# Patient Record
Sex: Female | Born: 1975
Health system: Southern US, Community
[De-identification: ages and names within clinical notes are randomized; demographics above are authoritative.]

## PROBLEM LIST (undated history)

## (undated) DIAGNOSIS — Z720 Tobacco use: Secondary | ICD-10-CM

## (undated) DIAGNOSIS — E785 Hyperlipidemia, unspecified: Secondary | ICD-10-CM

## (undated) DIAGNOSIS — J45909 Unspecified asthma, uncomplicated: Secondary | ICD-10-CM

## (undated) DIAGNOSIS — F419 Anxiety disorder, unspecified: Secondary | ICD-10-CM

## (undated) HISTORY — DX: Unspecified asthma, uncomplicated: J45.909

## (undated) HISTORY — PX: TUBAL LIGATION: SHX77

## (undated) HISTORY — DX: Anxiety disorder, unspecified: F41.9

## (undated) HISTORY — DX: Hyperlipidemia, unspecified: E78.5

## (undated) HISTORY — DX: Tobacco use: Z72.0

---

## 2015-12-06 ENCOUNTER — Encounter: Payer: Self-pay | Admitting: Physician Assistant

## 2016-12-18 ENCOUNTER — Encounter: Payer: Self-pay | Admitting: Physician Assistant

## 2018-10-13 ENCOUNTER — Ambulatory Visit (INDEPENDENT_AMBULATORY_CARE_PROVIDER_SITE_OTHER): Payer: Commercial Managed Care - PPO | Admitting: Physician Assistant

## 2018-10-13 ENCOUNTER — Encounter: Payer: Self-pay | Admitting: Physician Assistant

## 2018-10-13 VITALS — BP 121/86 | HR 90 | Resp 16 | Ht 63.0 in | Wt 192.0 lb

## 2018-10-13 DIAGNOSIS — E782 Mixed hyperlipidemia: Secondary | ICD-10-CM

## 2018-10-13 DIAGNOSIS — Z8349 Family history of other endocrine, nutritional and metabolic diseases: Secondary | ICD-10-CM | POA: Insufficient documentation

## 2018-10-13 DIAGNOSIS — Z23 Encounter for immunization: Secondary | ICD-10-CM | POA: Diagnosis not present

## 2018-10-13 DIAGNOSIS — Z1231 Encounter for screening mammogram for malignant neoplasm of breast: Secondary | ICD-10-CM

## 2018-10-13 DIAGNOSIS — Z7689 Persons encountering health services in other specified circumstances: Secondary | ICD-10-CM | POA: Diagnosis not present

## 2018-10-13 DIAGNOSIS — J45909 Unspecified asthma, uncomplicated: Secondary | ICD-10-CM | POA: Diagnosis not present

## 2018-10-13 DIAGNOSIS — R0600 Dyspnea, unspecified: Secondary | ICD-10-CM | POA: Diagnosis not present

## 2018-10-13 DIAGNOSIS — E785 Hyperlipidemia, unspecified: Secondary | ICD-10-CM

## 2018-10-13 DIAGNOSIS — R079 Chest pain, unspecified: Secondary | ICD-10-CM | POA: Diagnosis not present

## 2018-10-13 DIAGNOSIS — Z113 Encounter for screening for infections with a predominantly sexual mode of transmission: Secondary | ICD-10-CM

## 2018-10-13 DIAGNOSIS — F17200 Nicotine dependence, unspecified, uncomplicated: Secondary | ICD-10-CM

## 2018-10-13 DIAGNOSIS — Z130 Encounter for screening for diseases of the blood and blood-forming organs and certain disorders involving the immune mechanism: Secondary | ICD-10-CM

## 2018-10-13 DIAGNOSIS — R002 Palpitations: Secondary | ICD-10-CM | POA: Diagnosis not present

## 2018-10-13 MED ORDER — ALBUTEROL SULFATE HFA 108 (90 BASE) MCG/ACT IN AERS: 1.0000 | INHALATION_SPRAY | RESPIRATORY_TRACT | 0 refills | Status: DC | PRN

## 2018-10-13 NOTE — Patient Instructions (Signed)
For your blood pressure: - Goal <130/80 (Ideally 120's/70's) - monitor and log blood pressures at home - check around the same time each day in a relaxed setting - Limit salt to <2500 mg/day - Follow DASH (Dietary Approach to Stopping Hypertension) eating plan - Try to get at least 150 minutes of aerobic exercise per week - Aim to go on a brisk walk 30 minutes per day at least 5 days per week. If you're not active, gradually increase how long you walk by 5 minutes each week - limit alcohol: 2 standard drinks per day for men and 1 per day for women - avoid tobacco/nicotine products. Consider smoking cessation if you smoke - weight loss: 7% of current body weight can reduce your blood pressure by 5-10 points - follow-up at least every 6 months for your blood pressure. Follow-up sooner if your BP is not controlled   Nonspecific Chest Pain, Adult Chest pain can be caused by many different conditions. It can be caused by a condition that is life-threatening and requires treatment right away. It can also be caused by something that is not life-threatening. If you have chest pain, it can be hard to know the difference, so it is important to get help right away to make sure that you do not have a serious condition. Some life-threatening causes of chest pain include:  Heart attack.  A tear in the body's main blood vessel (aortic dissection).  Inflammation around your heart (pericarditis).  A problem in the lungs, such as a blood clot (pulmonary embolism) or a collapsed lung (pneumothorax). Some non life-threatening causes of chest pain include:  Heartburn.  Anxiety or stress.  Damage to the bones, muscles, and cartilage that make up your chest wall.  Pneumonia or bronchitis.  Shingles infection (varicella-zoster virus). Chest pain can feel like:  Pain or discomfort on the surface of your chest or deep in your chest.  Crushing, pressure, aching, or squeezing pain.  Burning or  tingling.  Dull or sharp pain that is worse when you move, cough, or take a deep breath.  Pain or discomfort that is also felt in your back, neck, jaw, shoulder, or arm, or pain that spreads to any of these areas. Your chest pain may come and go. It may also be constant. Your health care provider will do lab tests and other studies to find the cause of your pain. Treatment will depend on the cause of your chest pain. Follow these instructions at home: Medicines  Take over-the-counter and prescription medicines only as told by your health care provider.  If you were prescribed an antibiotic, take it as told by your health care provider. Do not stop taking the antibiotic even if you start to feel better. Lifestyle   Rest as directed by your health care provider.  Do not use any products that contain nicotine or tobacco, such as cigarettes and e-cigarettes. If you need help quitting, ask your health care provider.  Do not drink alcohol.  Make healthy lifestyle choices as recommended. These may include: ? Getting regular exercise. Ask your health care provider to suggest some activities that are safe for you. ? Eating a heart-healthy diet. This includes plenty of fresh fruits and vegetables, whole grains, low-fat (lean) protein, and low-fat dairy products. A dietitian can help you find healthy eating options. ? Maintaining a healthy weight. ? Managing any other health conditions you have, such as high blood pressure (hypertension) or diabetes. ? Reducing stress, such as with yoga  or relaxation techniques. General instructions  Pay attention to any changes in your symptoms. Tell your health care provider about them or any new symptoms.  Avoid any activities that cause chest pain.  Keep all follow-up visits as told by your health care provider. This is important. This includes visits for any further testing if your chest pain does not go away. Contact a health care provider if:  Your  chest pain does not go away.  You feel depressed.  You have a fever. Get help right away if:  Your chest pain gets worse.  You have a cough that gets worse, or you cough up blood.  You have severe pain in your abdomen.  You faint.  You have sudden, unexplained chest discomfort.  You have sudden, unexplained discomfort in your arms, back, neck, or jaw.  You have shortness of breath at any time.  You suddenly start to sweat, or your skin gets clammy.  You feel nausea or you vomit.  You suddenly feel lightheaded or dizzy.  You have severe weakness, or unexplained weakness or fatigue.  Your heart begins to beat quickly, or it feels like it is skipping beats. These symptoms may represent a serious problem that is an emergency. Do not wait to see if the symptoms will go away. Get medical help right away. Call your local emergency services (911 in the [place] Do not drive yourself to the hospital. Summary  Chest pain can be caused by a condition that is serious and requires urgent treatment. It may also be caused by something that is not life-threatening.  If you have chest pain, it is very important to see your health care provider. Your health care provider may do lab tests and other studies to find the cause of your pain.  Follow your health care provider's instructions on taking medicines, making lifestyle changes, and getting emergency treatment if symptoms become worse.  Keep all follow-up visits as told by your health care provider. This includes visits for any further testing if your chest pain does not go away. This information is not intended to replace advice given to you by your health care provider. Make sure you discuss any questions you have with your health care provider. Document Released: 06/12/2005 Document Revised: 03/05/2018 Document Reviewed: 03/05/2018 Elsevier Interactive Patient Education  2019 [employer]

## 2018-10-13 NOTE — Progress Notes (Signed)
HPI:                                                                Jean Reed is a 43 y.o. female who [facility] today for to establish care  Current concerns:  Chest Pain   This is a recurrent problem. The current episode started more than 1 year ago. The onset quality is sudden. The problem occurs intermittently. The problem has been waxing and waning. The pain is present in the substernal region and lateral region. The pain is moderate. The quality of the pain is described as tearing and pressure. Associated symptoms include back pain, dizziness, exertional chest pressure, headaches, near-syncope, numbness, palpitations and shortness of breath. Pertinent negatives include no abdominal pain or syncope. Risk factors include smoking/tobacco exposure and stress.  Her past medical history is significant for anxiety/panic attacks and hyperlipidemia.  Pertinent negatives for past medical history include no DVT, no PE and no stimulant use.  Her family medical history is significant for heart disease and hypertension.  Shortness of Breath  This is a recurrent problem. The current episode started more than 1 year ago. The problem occurs intermittently. The problem has been unchanged. Associated symptoms include chest pain, headaches, leg swelling and a rash. Pertinent negatives include no abdominal pain or syncope. Risk factors include smoking. She has tried nothing for the symptoms. There is no history of DVT or PE.  Feels like she is tearing a muscle. Last for several minutes. Reports her whole body feels like its pulsating. If she stands up too quickly she feels lightheaded   Anxiety: Husband passed away in 2017 of a heart attack. She took Fluoxetine for about 8 months during that time. Has also taken xanax as needed and would like to re-start this. She currently lives with her sister and their combined 10 children. States  home environment is very stressful.   Reports strong family hx of thyroiditis on maternal side including mother.  In may 2018 states her whole left arm was numb for a month. She has intermittent hand numbness now.    Depression screen PHQ 2/9 10/13/2018  Decreased Interest 1  Down, Depressed, Hopeless 1  PHQ - 2 Score 2    GAD 7 : Generalized Anxiety Score 10/13/2018  Nervous, Anxious, on Edge 1  Control/stop worrying 1  Worry too much - different things 1  Trouble relaxing 0  Restless 0  Easily annoyed or irritable 1  Afraid - awful might happen 0  Total GAD 7 Score 4  Anxiety Difficulty Not difficult at all      Past Medical History:  Diagnosis Date  . Anxiety   . Asthma    dx in childhood  . Hyperlipidemia   . Tobacco use    Past Surgical History:  Procedure Laterality Date  . TUBAL LIGATION     Social History   Tobacco Use  . Smoking status: Current Every Day Smoker    Packs/day: 0.50    Years: 27.00    Pack years: 13.50  . Smokeless tobacco: Never Used  Substance Use Topics  . Alcohol use: Not Currently   family history includes Diabetes in her father; Hashimoto's thyroiditis in her maternal grandmother and mother;  Heart attack in an other family member; Hypertension in her father; Skin cancer in her father and paternal grandfather; Stroke in an other family member.    ROS: Review of Systems  Eyes: Positive for visual disturbance.  Respiratory: Positive for shortness of breath.   Cardiovascular: Positive for chest pain, palpitations, leg swelling and near-syncope. Negative for syncope.  Gastrointestinal: Negative for abdominal pain.  Musculoskeletal: Positive for arthralgias and back pain.  Skin: Positive for rash.  Neurological: Positive for dizziness, numbness and headaches.  Psychiatric/Behavioral: Positive for dysphoric mood. The patient is nervous/anxious.      Medications: Current Outpatient Medications  Medication Sig Dispense Refill  .  albuterol (PROVENTIL HFA;VENTOLIN HFA) 108 (90 Base) MCG/ACT inhaler Inhale 1-2 puffs into the lungs every 4 (four) hours as needed for wheezing or shortness of breath (bronchospasm). 1 Inhaler 0  . atorvastatin (LIPITOR) 20 MG tablet Take 1 tablet (20 mg total) by mouth daily. 90 tablet 1   No current facility-administered medications for this visit.    No Known Allergies     Objective:  BP 121/86   Pulse 90   Resp 16   Ht 5\' 3"  (1.6 m)   Wt 192 lb (87.1 kg)   LMP 10/03/2018 (Exact Date)   SpO2 98%   BMI 34.01 kg/m  Gen:  alert, not ill-appearing, no distress, appropriate for age, obese female HEENT: head normocephalic without obvious abnormality, conjunctiva and cornea clear, trachea midline Pulm: Normal work of breathing, normal phonation, expiratory wheeze and coarse breath sounds in the left upper lung fields CV: Normal rate, regular rhythm, s1 and s2 distinct, no murmurs, clicks or rubs  Neuro: alert and oriented x 3, no tremor MSK: extremities atraumatic, normal gait and station, no peripheral edema Skin: intact, no rashes on exposed skin, no jaundice, no cyanosis Psych: well-groomed, cooperative, good eye contact, euthymic mood, affect mood-congruent, speech is articulate, and thought processes clear and goal-directed  ECG 10/13/2018 9:57 am Vent rate 85 bpm PR-I 166 ,s QRS 72 ms QT/QTc 382/454 Normal sinus rhythm, T wave abnormality  The 10-year ASCVD risk score Denman George(Goff DC Jr., et al., 2013) is: 5.3%   Values used to calculate the score:     Age: [age] years     Sex: Female     Is Non-Hispanic African American: No     Diabetic: No     Tobacco smoker: Yes     Systolic Blood Pressure: 121 mmHg     Is BP treated: No     HDL Cholesterol: 38 mg/dL     Total Cholesterol: 221 mg/dL   No results found for this or any previous visit (from the past 72 hour(s)). No results found.    Assessment and Plan: 43 y.o. female with   .Domingue was seen today for establish  care.  Diagnoses and all orders for this visit:  Encounter to establish care  Palpitations -     Thyroid Panel With TSH -     CBC -     COMPLETE METABOLIC PANEL WITH GFR -     EKG 12-Lead  Family history of thyroiditis -     Thyroid Panel With TSH  Routine screening for STI (sexually transmitted infection) -     C. trachomatis/N. gonorrhoeae RNA -     Hepatitis C antibody -     HIV Antibody (routine testing w rflx) -     RPR -     Trichomonas vaginalis, RNA  Hyperlipidemia, unspecified hyperlipidemia type -  Lipid Panel w/reflex Direct LDL  Screening for blood disease -     CBC -     COMPLETE METABOLIC PANEL WITH GFR  Breast cancer screening by mammogram -     MM 3D SCREEN BREAST BILATERAL; Future  Chest pain, unspecified type -     DG Chest 2 View -     Ambulatory referral to Cardiology  Dyspnea, unspecified type -     DG Chest 2 View -     albuterol (PROVENTIL HFA;VENTOLIN HFA) 108 (90 Base) MCG/ACT inhaler; Inhale 1-2 puffs into the lungs every 4 (four) hours as needed for wheezing or shortness of breath (bronchospasm).  Reactive airway disease without complication, unspecified asthma severity, unspecified whether persistent -     albuterol (PROVENTIL HFA;VENTOLIN HFA) 108 (90 Base) MCG/ACT inhaler; Inhale 1-2 puffs into the lungs every 4 (four) hours as needed for wheezing or shortness of breath (bronchospasm).  Tobacco use disorder  Need for Tdap vaccination -     Tdap vaccine greater than or equal to 7yo IM  Mixed hyperlipidemia -     atorvastatin (LIPITOR) 20 MG tablet; Take 1 tablet (20 mg total) by mouth daily.   - Personally reviewed PMH, PSH, PFH, medications, allergies, HM - Age-appropriate cancer screening: UTD per patient, record requested; mammogram ordered - Influenza declined - Tdap given today - PHQ2 negative  Chest pain Episodic retrosternal chest pain present for over a year.  Currently asymptomatic, low clinical suspicion for ACS ECG  performed in office today and personally reviewed by me.  Normal sinus rhythm, normal axis, no ST elevations or depressions T wave inversions were present Due to CVD risk factors, referring to cardiology for further evaluation Patient was counseled on therapeutic lifestyle changes ED precautions discussed  Shortness of breath Current smoker 13.5 packyears Self-reported hx of asthma No prior PFT's CXR pending to assess for infiltrate/edema No evidence of JVD or peripheral edema on exam Pulse ox 98% on RA at rest Coarse breath sounds with expiratory wheezes. Suspect underlying reactive airway disease vs COPD Trial of Albuterol F/u in 2 weeks for spirometry  Patient education and anticipatory guidance given Patient agrees with treatment plan Follow-up in 2 weeks for PFT's or sooner as needed if symptoms worsen or fail to improve  Levonne Hubert PA-C

## 2018-10-14 LAB — COMPLETE METABOLIC PANEL WITH GFR
AG Ratio: 1.7 (calc) (ref 1.0–2.5)
ALT: 10 U/L (ref 6–29)
AST: 10 U/L (ref 10–30)
Albumin: 4.3 g/dL (ref 3.6–5.1)
Alkaline phosphatase (APISO): 60 U/L (ref 33–115)
BUN: 10 mg/dL (ref 7–25)
CO2: 26 mmol/L (ref 20–32)
Calcium: 9.4 mg/dL (ref 8.6–10.2)
Chloride: 103 mmol/L (ref 98–110)
Creat: 0.7 mg/dL (ref 0.50–1.10)
GFR, Est African American: 125 mL/min/{1.73_m2} (ref >=60)
GFR, Est Non African American: 108 mL/min/{1.73_m2} (ref >=60)
GLUCOSE: 94 mg/dL (ref 65–99)
Globulin: 2.6 g/dL (calc) (ref 1.9–3.7)
Potassium: 4.9 mmol/L (ref 3.5–5.3)
Sodium: 137 mmol/L (ref 135–146)
Total Bilirubin: 0.5 mg/dL (ref 0.2–1.2)
Total Protein: 6.9 g/dL (ref 6.1–8.1)

## 2018-10-14 LAB — LIPID PANEL W/REFLEX DIRECT LDL
CHOLESTEROL: 221 mg/dL — AB (ref <200)
HDL: 38 mg/dL — ABNORMAL LOW (ref >50)
LDL Cholesterol (Calc): 156 mg/dL (calc) — ABNORMAL HIGH
Non-HDL Cholesterol (Calc): 183 mg/dL (calc) — ABNORMAL HIGH (ref <130)
Total CHOL/HDL Ratio: 5.8 (calc) — ABNORMAL HIGH (ref <5.0)
Triglycerides: 138 mg/dL (ref <150)

## 2018-10-14 LAB — CBC
HCT: 42 % (ref 35.0–45.0)
Hemoglobin: 13.8 g/dL (ref 11.7–15.5)
MCH: 28.3 pg (ref 27.0–33.0)
MCHC: 32.9 g/dL (ref 32.0–36.0)
MCV: 86.2 fL (ref 80.0–100.0)
MPV: 12.8 fL — ABNORMAL HIGH (ref 7.5–12.5)
Platelets: 292 10*3/uL (ref 140–400)
RBC: 4.87 10*6/uL (ref 3.80–5.10)
RDW: 13 % (ref 11.0–15.0)
WBC: 7.9 10*3/uL (ref 3.8–10.8)

## 2018-10-14 LAB — THYROID PANEL WITH TSH
Free Thyroxine Index: 2.5 (ref 1.4–3.8)
T3 Uptake: 25 % (ref 22–35)
T4, Total: 9.8 ug/dL (ref 5.1–11.9)
TSH: 1.06 mIU/L

## 2018-10-14 LAB — C. TRACHOMATIS/N. GONORRHOEAE RNA
C. trachomatis RNA, TMA: NOT DETECTED
N. gonorrhoeae RNA, TMA: NOT DETECTED

## 2018-10-14 LAB — HEPATITIS C ANTIBODY
Hepatitis C Ab: NONREACTIVE
SIGNAL TO CUT-OFF: 0.01 (ref <1.00)

## 2018-10-14 LAB — RPR: RPR Ser Ql: NONREACTIVE

## 2018-10-14 LAB — TRICHOMONAS VAGINALIS, PROBE AMP: Trichomonas vaginalis RNA: NOT DETECTED

## 2018-10-14 LAB — HIV ANTIBODY (ROUTINE TESTING W REFLEX): HIV 1&2 Ab, 4th Generation: NONREACTIVE

## 2018-10-16 ENCOUNTER — Encounter: Payer: Self-pay | Admitting: Physician Assistant

## 2018-10-16 MED ORDER — ATORVASTATIN CALCIUM 20 MG PO TABS: 20.0000 mg | ORAL_TABLET | Freq: Every day | ORAL | 1 refills | Status: DC

## 2018-10-23 ENCOUNTER — Encounter: Payer: Self-pay | Admitting: Physician Assistant

## 2018-10-28 ENCOUNTER — Ambulatory Visit (INDEPENDENT_AMBULATORY_CARE_PROVIDER_SITE_OTHER): Payer: Commercial Managed Care - PPO | Admitting: Physician Assistant

## 2018-10-28 ENCOUNTER — Ambulatory Visit (INDEPENDENT_AMBULATORY_CARE_PROVIDER_SITE_OTHER): Payer: Commercial Managed Care - PPO

## 2018-10-28 ENCOUNTER — Encounter: Payer: Self-pay | Admitting: Physician Assistant

## 2018-10-28 VITALS — BP 119/78 | HR 83 | Temp 97.6°F | Wt 194.0 lb

## 2018-10-28 DIAGNOSIS — R0600 Dyspnea, unspecified: Secondary | ICD-10-CM | POA: Diagnosis not present

## 2018-10-28 DIAGNOSIS — Z008 Encounter for other general examination: Secondary | ICD-10-CM | POA: Diagnosis not present

## 2018-10-28 DIAGNOSIS — F17200 Nicotine dependence, unspecified, uncomplicated: Secondary | ICD-10-CM

## 2018-10-28 DIAGNOSIS — Z1231 Encounter for screening mammogram for malignant neoplasm of breast: Secondary | ICD-10-CM

## 2018-10-28 DIAGNOSIS — R079 Chest pain, unspecified: Secondary | ICD-10-CM | POA: Diagnosis not present

## 2018-10-28 MED ORDER — ALBUTEROL SULFATE (2.5 MG/3ML) 0.083% IN NEBU
2.5000 mg | INHALATION_SOLUTION | Freq: Once | RESPIRATORY_TRACT | Status: AC
  Administered 2018-10-28: 2.5 mg via RESPIRATORY_TRACT

## 2018-10-28 NOTE — Patient Instructions (Addendum)
[facility] Tel:[phone]

## 2018-10-28 NOTE — Progress Notes (Signed)
HPI:                                                                Jean Reed is a 43 y.o. female who [facility] today for spirometry  Current smoker with approximately 13.5 pack years with self-reported history of asthma in childhood presented at an establish care visit approximately 2 weeks ago with complaints of shortness of breath.  This is been a chronic problem for over 1 year, unchanged. Occurs intermittently, both at rest and with exertion, sometimes associated with emotional stress/anxiety. Denies cough or wheezing.   Past Medical History:  Diagnosis Date  . Anxiety   . Asthma    dx in childhood  . Hyperlipidemia   . Tobacco use    Past Surgical History:  Procedure Laterality Date  . TUBAL LIGATION     Social History   Tobacco Use  . Smoking status: Current Every Day Smoker    Packs/day: 0.50    Years: 27.00    Pack years: 13.50  . Smokeless tobacco: Never Used  Substance Use Topics  . Alcohol use: Not Currently   family history includes Diabetes in her father; Hashimoto's thyroiditis in her maternal grandmother and mother; Heart attack in an other family member; Hypertension in her father; Skin cancer in her father and paternal grandfather; Stroke in an other family member.    ROS: Review of Systems  Eyes: Positive for visual disturbance.  Respiratory: Positive for shortness of breath.   Cardiovascular: Positive for chest pain, palpitations, leg swelling and near-syncope. Negative for syncope.  Gastrointestinal: Negative for abdominal pain.  Musculoskeletal: Positive for arthralgias and back pain.  Skin: Positive for rash.  Neurological: Positive for dizziness, numbness and headaches.  Psychiatric/Behavioral: Positive for dysphoric mood. The patient is nervous/anxious.    Medications: Current Outpatient Medications  Medication Sig Dispense Refill  . albuterol (PROVENTIL  HFA;VENTOLIN HFA) 108 (90 Base) MCG/ACT inhaler Inhale 1-2 puffs into the lungs every 4 (four) hours as needed for wheezing or shortness of breath (bronchospasm). 1 Inhaler 0  . atorvastatin (LIPITOR) 20 MG tablet Take 1 tablet (20 mg total) by mouth daily. 90 tablet 1   No current facility-administered medications for this visit.    No Known Allergies     Objective:  BP 119/78   Pulse 83   Temp 97.6 F (36.4 C) (Oral)   Wt 194 lb (88 kg)   LMP 10/03/2018 (Exact Date)   SpO2 95%   PF 383 L/min   BMI 34.37 kg/m    No results found for this or any previous visit (from the past 72 hour(s)). Dg Chest 2 View  Result Date: 10/28/2018 CLINICAL DATA:  Chest pain and dyspnea.  Hyperlipidemia.  Smoker. EXAM: CHEST - 2 VIEW COMPARISON:  None. FINDINGS: The heart size and mediastinal contours are within normal limits. Both lungs are clear. The visualized skeletal structures are unremarkable. IMPRESSION: Negative.  No active cardiopulmonary disease. Electronically Signed   By: Myles RosenthalJohn  Stahl M.D.   On: 10/28/2018 11:18    Office Spirometry Results: Peak Flow: 383 L/min FEV1: 2.64 liters FVC: 3.24 liters FEV1/FVC: 81.5 % FVC  % Predicted: 91 % FEV % Predicted: 91 % FeF 25-75: 2.57 liters FeF  25-75 % Predicted: 85    Assessment and Plan: 43 y.o. female with   .Diagnoses and all orders for this visit:  Encounter for pulmonary function testing  Dyspnea, unspecified type -     albuterol (PROVENTIL) (2.5 MG/3ML) 0.083% nebulizer solution 2.5 mg -     PR EVAL OF BRONCHOSPASM  Tobacco use disorder   Personally reviewed PFT's Normal spirometry in office today with FEV1 91% predicted Personally reviewed CXR, no evidence of acute cardiopulmonary disease Patient counseled to consider smoking cessation Discussed that there may be some underlying reactive airway disease that is not currently triggered, so okay to continue Albuterol prn Discussed that symptoms are most likely due to  de-conditioning. She was encouraged to start a gradual aerobic exercise program beginning with walking on treadmill and advancing as tolerated to a walk-jog Keep follow-up with Cardiology for chest pain  Patient education and anticipatory guidance given Patient agrees with treatment plan Follow-up as needed if symptoms worsen or fail to improve  I spent 10 minutes with this patient, greater than 50% was face-to-face time counseling regarding the above diagnoses  Levonne Hubertharley E.  PA-C

## 2018-11-05 ENCOUNTER — Other Ambulatory Visit: Payer: Self-pay | Admitting: Physician Assistant

## 2018-11-05 DIAGNOSIS — R0600 Dyspnea, unspecified: Secondary | ICD-10-CM

## 2018-11-05 DIAGNOSIS — J45909 Unspecified asthma, uncomplicated: Secondary | ICD-10-CM

## 2019-03-04 ENCOUNTER — Other Ambulatory Visit: Payer: Self-pay | Admitting: Physician Assistant

## 2019-03-04 DIAGNOSIS — E782 Mixed hyperlipidemia: Secondary | ICD-10-CM

## 2019-11-09 IMAGING — MG DIGITAL SCREENING BILATERAL MAMMOGRAM WITH TOMO AND CAD
6 of 12 series · 6 of 36 positions shown · non-contrast
Comparison: Previous exam(s).

CLINICAL DATA: Screening.

EXAM:
DIGITAL SCREENING BILATERAL MAMMOGRAM WITH TOMO AND CAD

[L CC synth-2D]
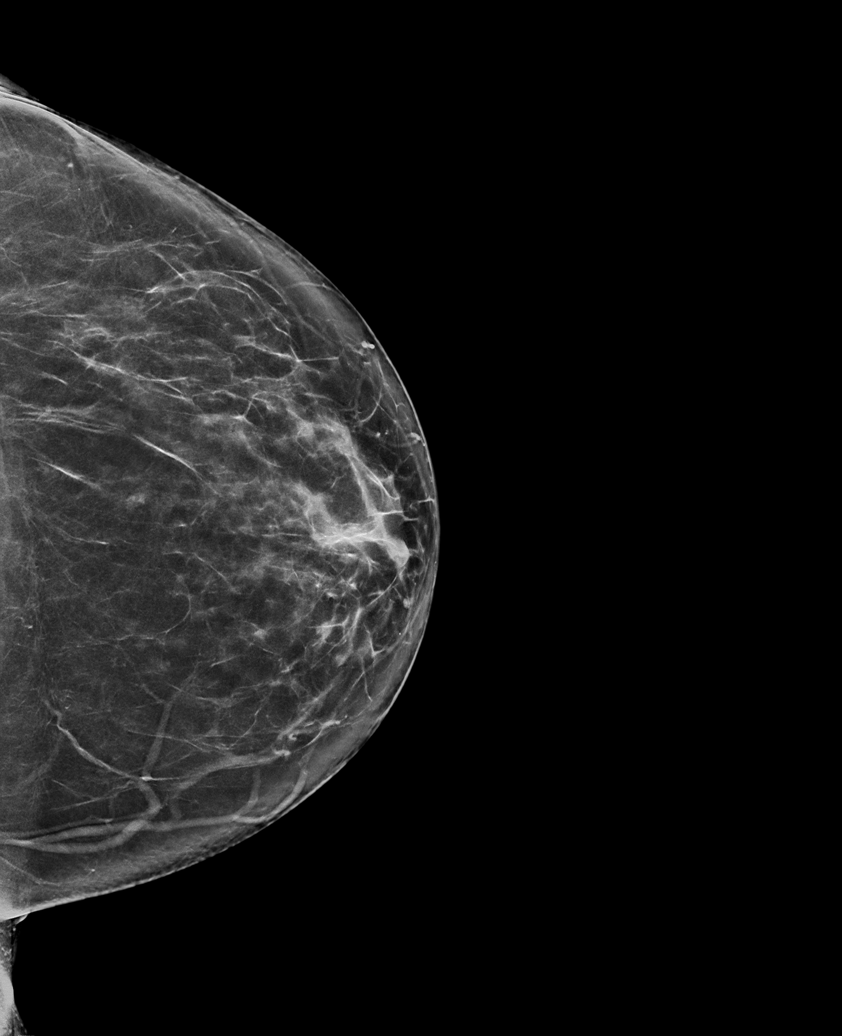

[L MLO synth-2D (1 of 2)]
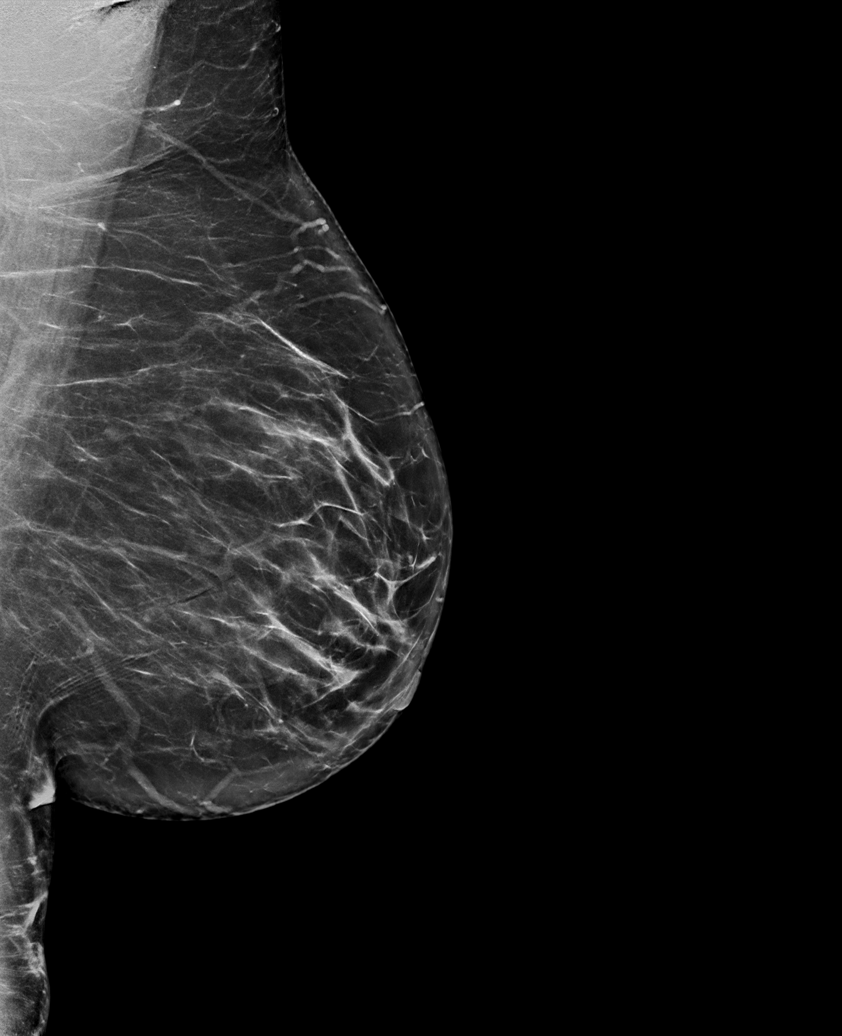

[R CC synth-2D (1 of 2)]
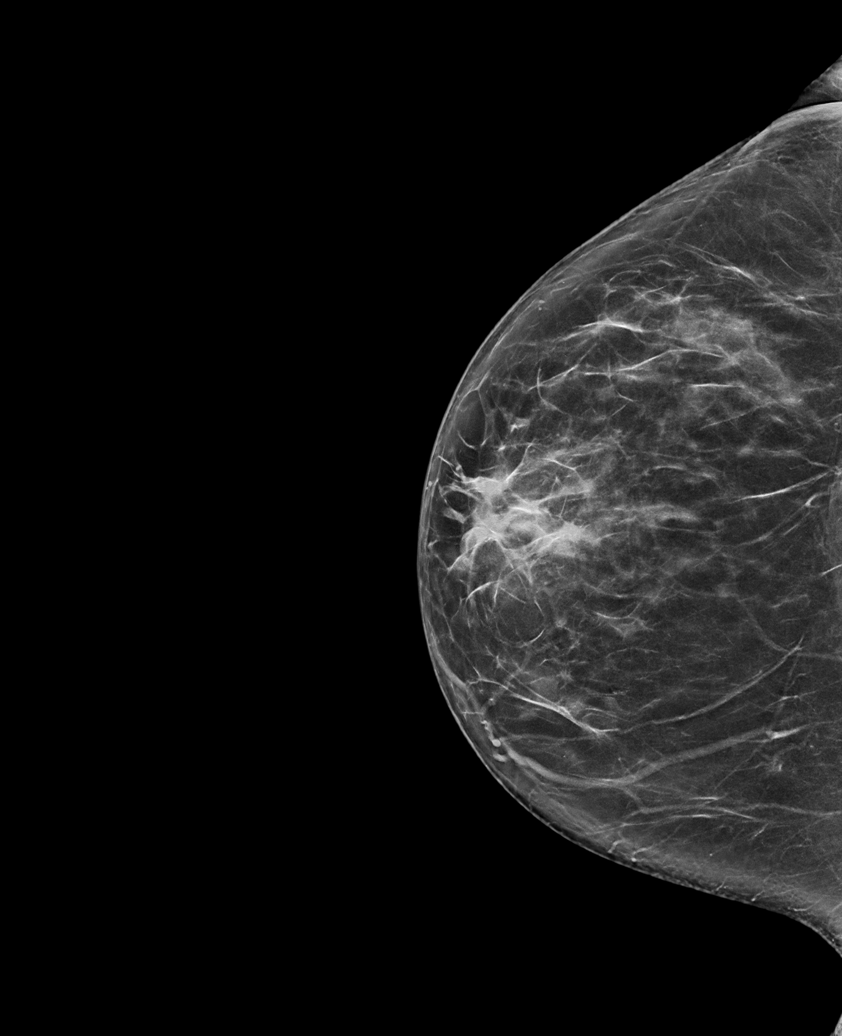

[R MLO synth-2D]
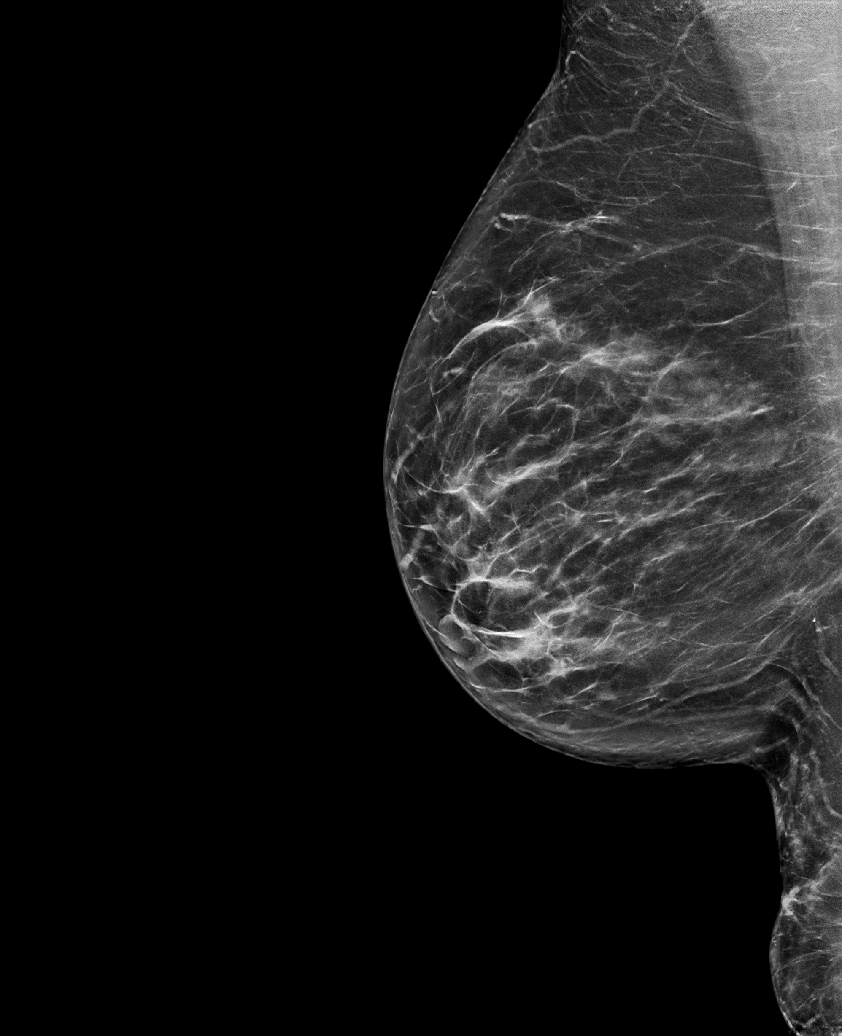

[R CC synth-2D (2 of 2)]
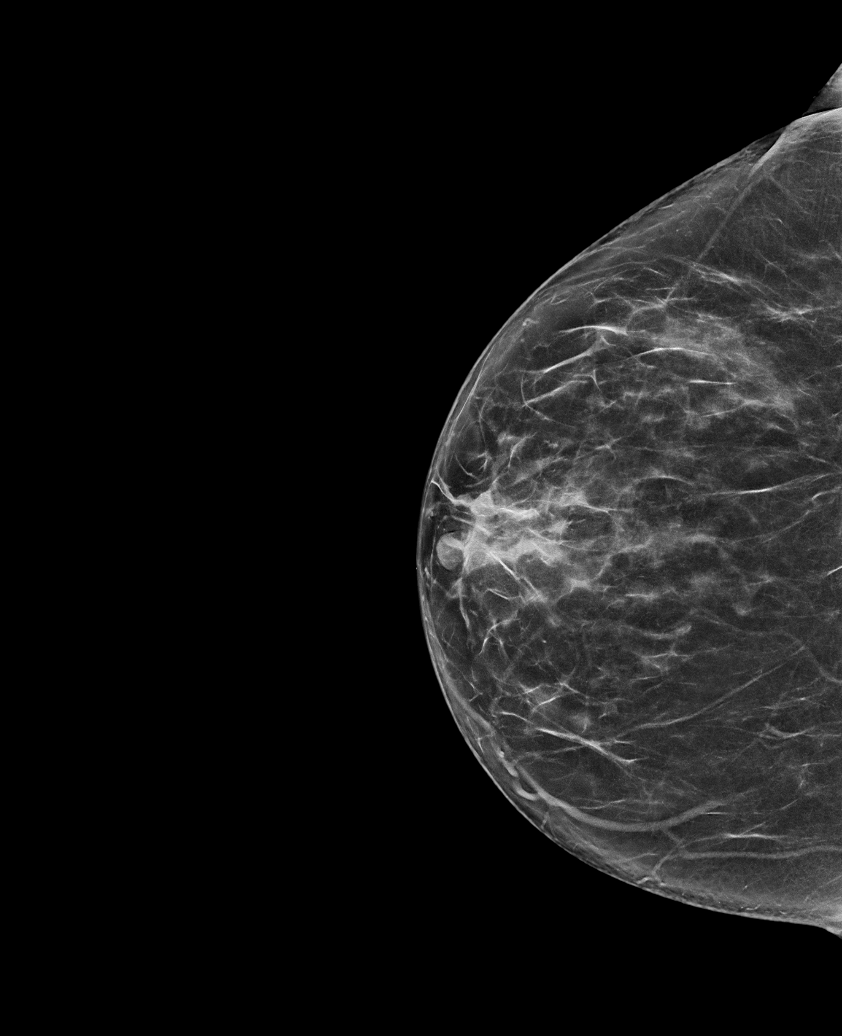

[L MLO synth-2D (2 of 2)]
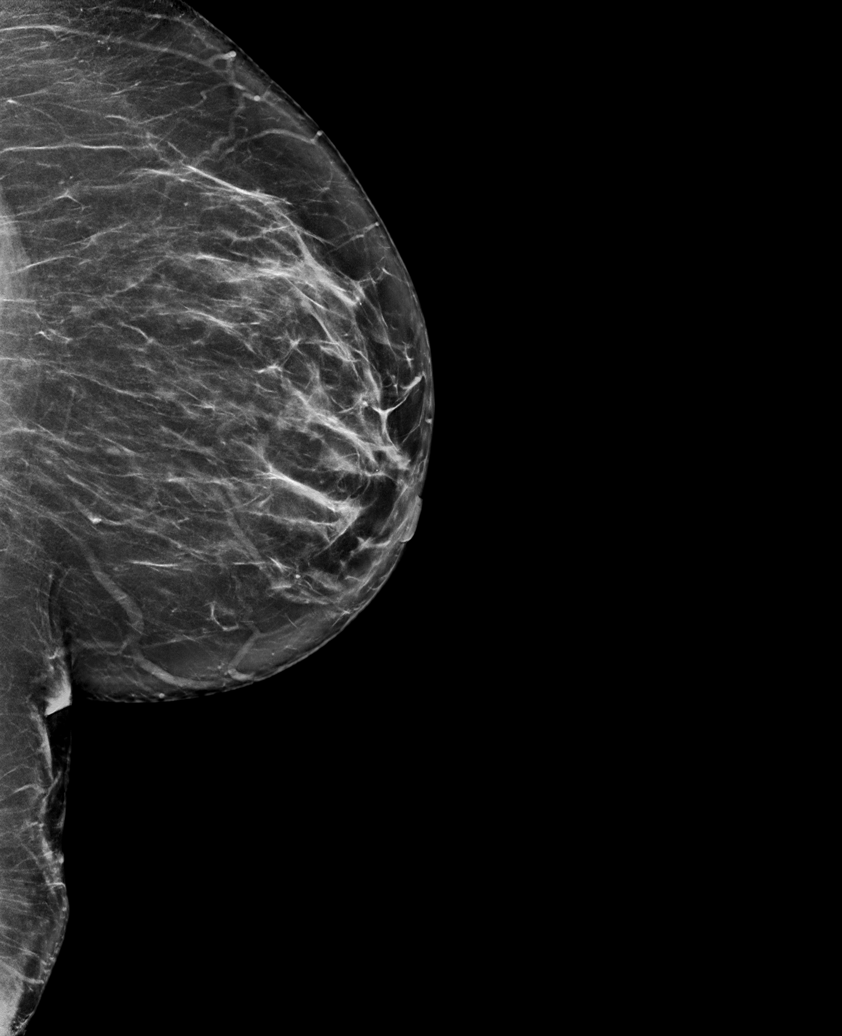

[6 of 36 positions shown; findings below may reference images not displayed]

ACR Breast Density Category b: There are scattered areas of
fibroglandular density.
FINDINGS: There are no findings suspicious for malignancy. Images were
processed with CAD.
IMPRESSION: No mammographic evidence of malignancy. A result letter of this
screening mammogram will be mailed directly to the patient.

RECOMMENDATION:
Screening mammogram in one year. (Code:[id number])

BI-RADS CATEGORY  1: Negative.

## 2019-11-09 IMAGING — DX DG CHEST 2V
2 series · 2 of 2 positions shown · non-contrast
Comparison: None.

CLINICAL DATA: Chest pain and dyspnea.  Hyperlipidemia.  Smoker.

EXAM:
CHEST - 2 VIEW

[chest pa]
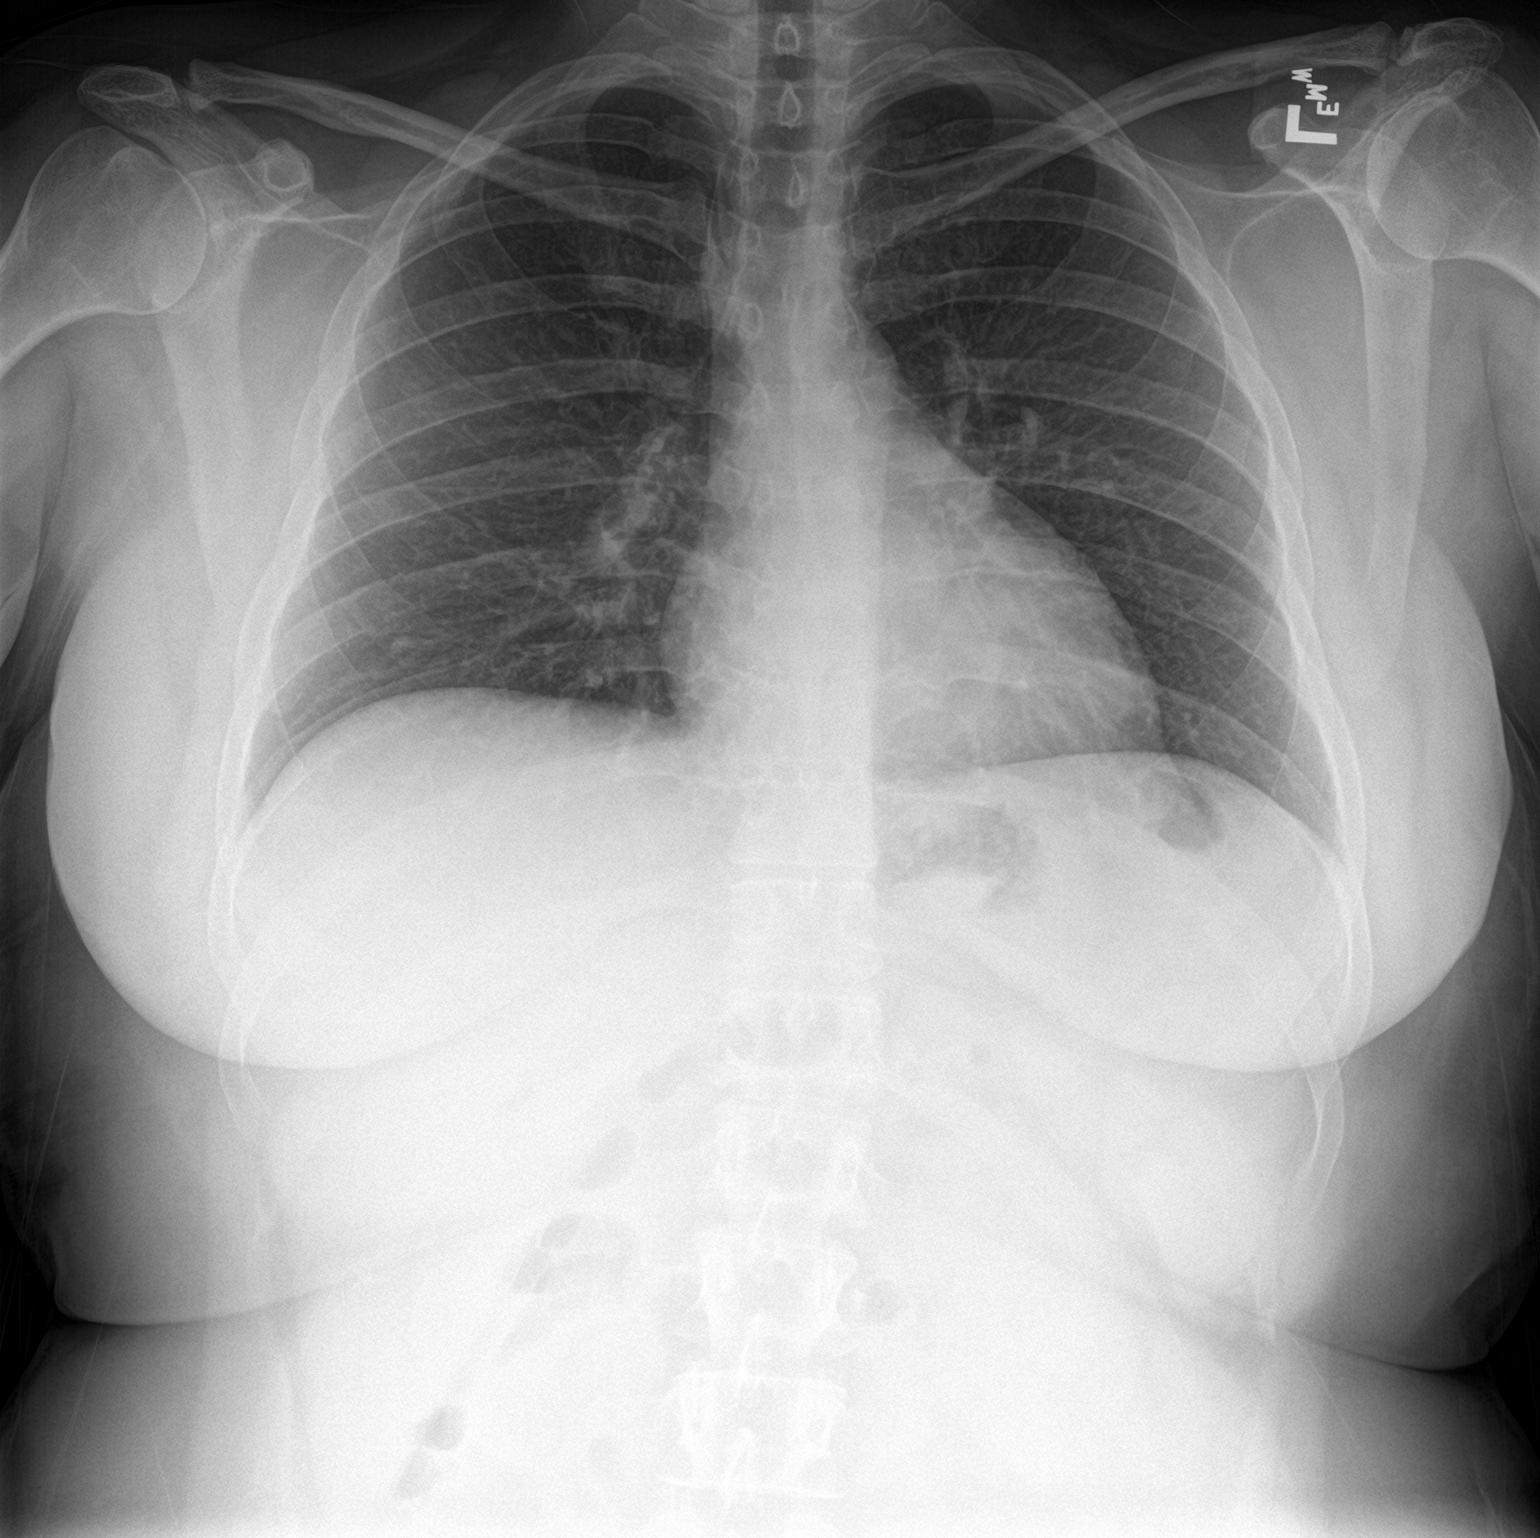

[chest lat]
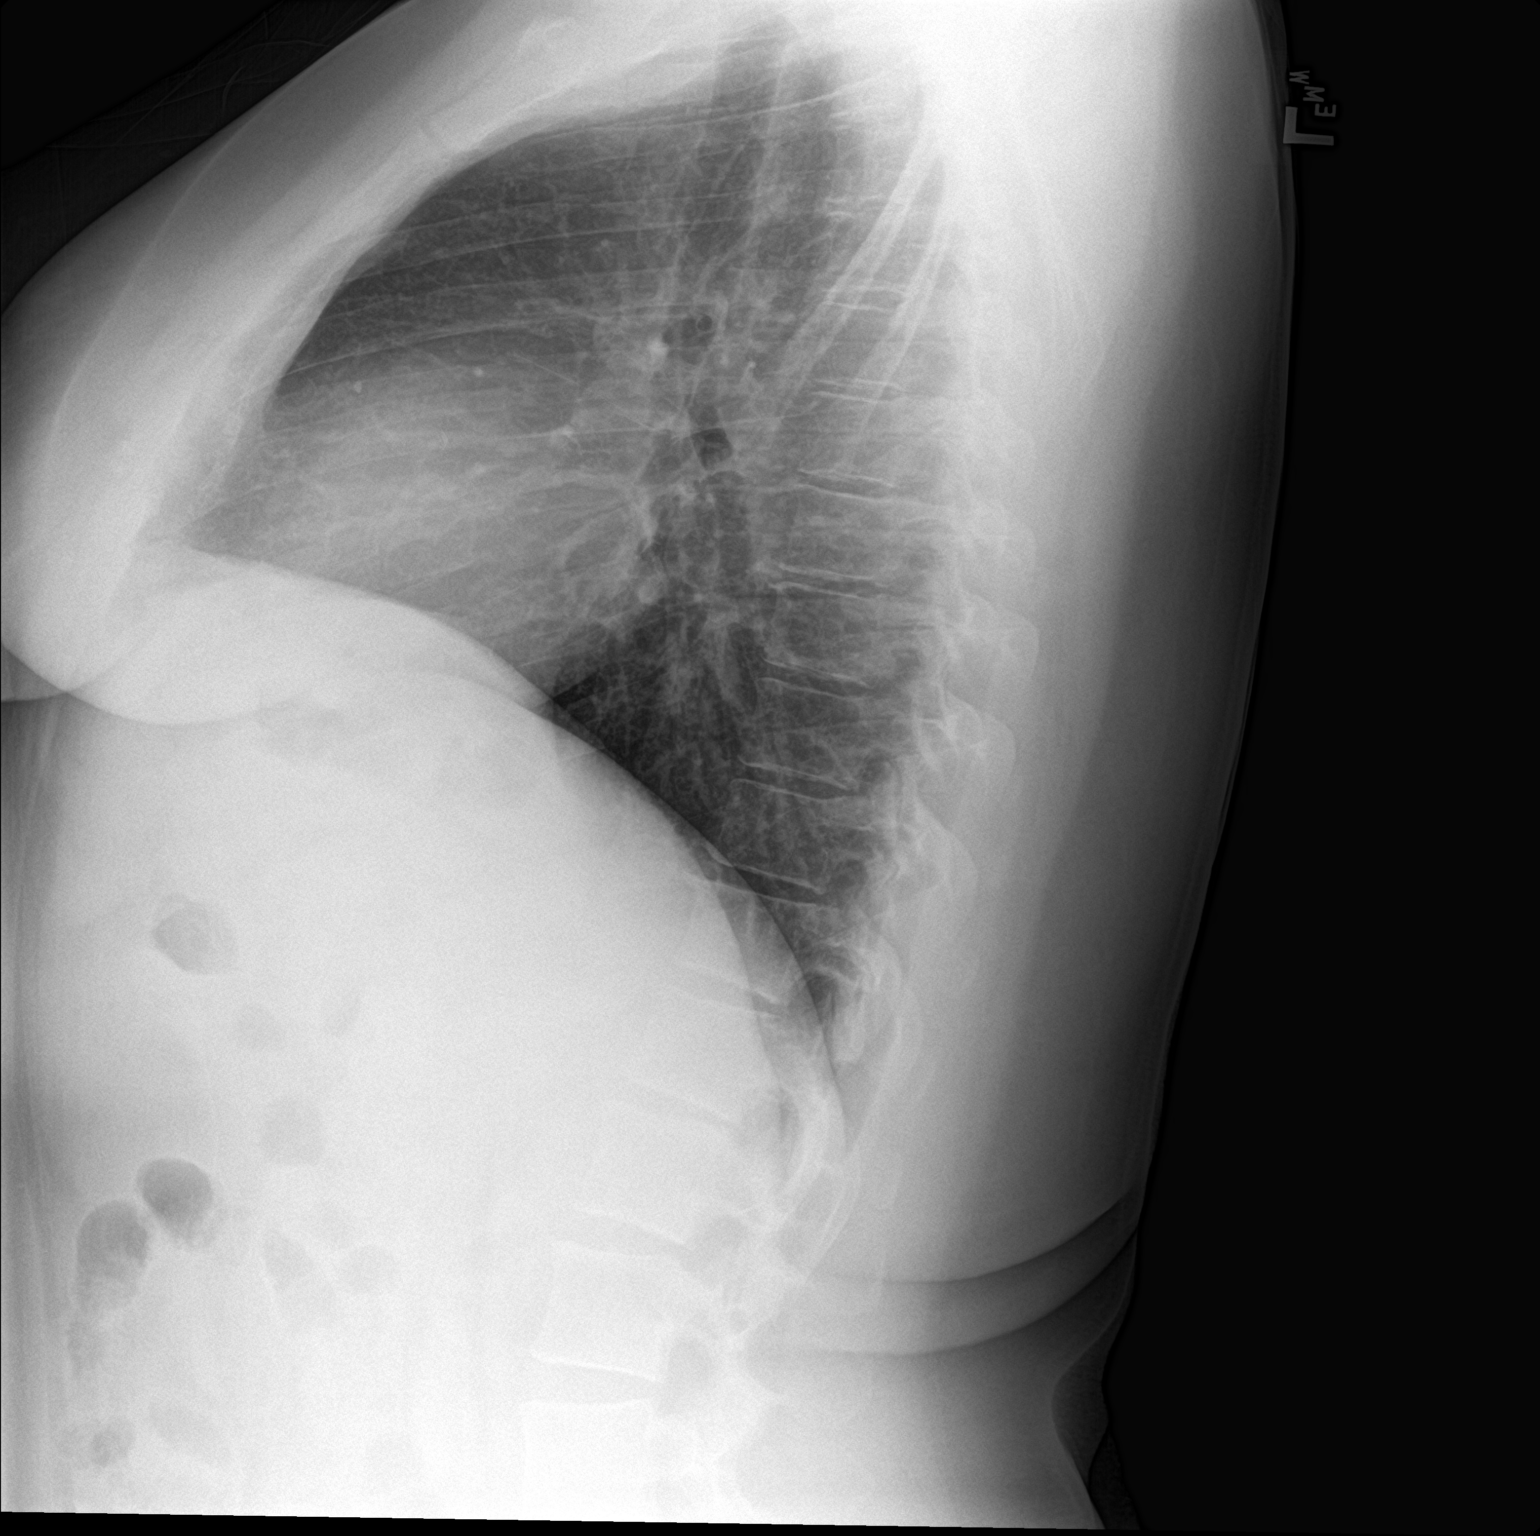

[2 of 2 positions shown; findings below may reference images not displayed]

FINDINGS: The heart size and mediastinal contours are within normal limits.
Both lungs are clear. The visualized skeletal structures are
unremarkable.
IMPRESSION: Negative.  No active cardiopulmonary disease.
